# Patient Record
Sex: Female | Born: 1990 | Race: Black or African American | Hispanic: No | Smoking: Never smoker
Health system: Southern US, Community
[De-identification: ages and names within clinical notes are randomized; demographics above are authoritative.]

## PROBLEM LIST (undated history)

## (undated) DIAGNOSIS — F909 Attention-deficit hyperactivity disorder, unspecified type: Secondary | ICD-10-CM

## (undated) DIAGNOSIS — J45909 Unspecified asthma, uncomplicated: Secondary | ICD-10-CM

## (undated) DIAGNOSIS — S060X9A Concussion with loss of consciousness of unspecified duration, initial encounter: Secondary | ICD-10-CM

## (undated) DIAGNOSIS — T7840XA Allergy, unspecified, initial encounter: Secondary | ICD-10-CM

## (undated) DIAGNOSIS — K649 Unspecified hemorrhoids: Secondary | ICD-10-CM

## (undated) DIAGNOSIS — Z8489 Family history of other specified conditions: Secondary | ICD-10-CM

## (undated) DIAGNOSIS — M25519 Pain in unspecified shoulder: Secondary | ICD-10-CM

## (undated) DIAGNOSIS — S060XAA Concussion with loss of consciousness status unknown, initial encounter: Secondary | ICD-10-CM

## (undated) DIAGNOSIS — R635 Abnormal weight gain: Secondary | ICD-10-CM

## (undated) DIAGNOSIS — R51 Headache: Secondary | ICD-10-CM

## (undated) DIAGNOSIS — R42 Dizziness and giddiness: Secondary | ICD-10-CM

## (undated) HISTORY — DX: Abnormal weight gain: R63.5

## (undated) HISTORY — PX: FOOT SURGERY: SHX648

## (undated) HISTORY — DX: Allergy, unspecified, initial encounter: T78.40XA

## (undated) HISTORY — PX: OTHER SURGICAL HISTORY: SHX169

## (undated) HISTORY — DX: Unspecified asthma, uncomplicated: J45.909

---

## 2012-12-10 ENCOUNTER — Emergency Department (HOSPITAL_COMMUNITY): Payer: 59

## 2012-12-10 ENCOUNTER — Emergency Department (HOSPITAL_COMMUNITY)
Admission: EM | Admit: 2012-12-10 | Discharge: 2012-12-10 | Disposition: A | Discharge status: Home / Self Care | Payer: 59 | Attending: Emergency Medicine | Admitting: Emergency Medicine

## 2012-12-10 DIAGNOSIS — Z79899 Other long term (current) drug therapy: Secondary | ICD-10-CM | POA: Insufficient documentation

## 2012-12-10 DIAGNOSIS — Z3202 Encounter for pregnancy test, result negative: Secondary | ICD-10-CM | POA: Insufficient documentation

## 2012-12-10 DIAGNOSIS — K8050 Calculus of bile duct without cholangitis or cholecystitis without obstruction: Secondary | ICD-10-CM

## 2012-12-10 DIAGNOSIS — R112 Nausea with vomiting, unspecified: Secondary | ICD-10-CM | POA: Insufficient documentation

## 2012-12-10 DIAGNOSIS — K8020 Calculus of gallbladder without cholecystitis without obstruction: Secondary | ICD-10-CM | POA: Insufficient documentation

## 2012-12-10 LAB — CBC WITH DIFFERENTIAL/PLATELET
Basophils Absolute: 0.1 10*3/uL (ref 0.0–0.1)
Eosinophils Relative: 8 % — ABNORMAL HIGH (ref 0–5)
HCT: 34 % — ABNORMAL LOW (ref 36.0–46.0)
Hemoglobin: 11.5 g/dL — ABNORMAL LOW (ref 12.0–15.0)
Lymphocytes Relative: 39 % (ref 12–46)
MCHC: 33.8 g/dL (ref 30.0–36.0)
MCV: 83.3 fL (ref 78.0–100.0)
Monocytes Absolute: 0.6 10*3/uL (ref 0.1–1.0)
Monocytes Relative: 6 % (ref 3–12)
RDW: 13.1 % (ref 11.5–15.5)
WBC: 9.3 10*3/uL (ref 4.0–10.5)

## 2012-12-10 LAB — COMPREHENSIVE METABOLIC PANEL
AST: 16 U/L (ref 0–37)
BUN: 10 mg/dL (ref 6–23)
CO2: 26 mEq/L (ref 19–32)
Calcium: 8.9 mg/dL (ref 8.4–10.5)
Chloride: 105 mEq/L (ref 96–112)
Creatinine, Ser: 0.78 mg/dL (ref 0.50–1.10)
GFR calc Af Amer: >90 mL/min (ref >90)
GFR calc non Af Amer: >90 mL/min (ref >90)
Total Bilirubin: 0.1 mg/dL — ABNORMAL LOW (ref 0.3–1.2)

## 2012-12-10 LAB — URINALYSIS, ROUTINE W REFLEX MICROSCOPIC
Glucose, UA: NEGATIVE mg/dL
Ketones, ur: NEGATIVE mg/dL
Leukocytes, UA: NEGATIVE
Nitrite: NEGATIVE
Protein, ur: NEGATIVE mg/dL
Urobilinogen, UA: 1 mg/dL (ref 0.0–1.0)

## 2012-12-10 LAB — LIPASE, BLOOD: Lipase: 31 U/L (ref 11–59)

## 2012-12-10 LAB — POCT PREGNANCY, URINE: Preg Test, Ur: NEGATIVE

## 2012-12-10 MED ORDER — SODIUM CHLORIDE 0.9 % IV BOLUS (SEPSIS)
1000.0000 mL | Freq: Once | INTRAVENOUS | Status: AC
  Administered 2012-12-10: 1000 mL via INTRAVENOUS

## 2012-12-10 MED ORDER — ONDANSETRON HCL 4 MG/2ML IJ SOLN
4.0000 mg | Freq: Once | INTRAMUSCULAR | Status: AC
  Administered 2012-12-10: 4 mg via INTRAVENOUS
  Filled 2012-12-10: qty 2

## 2012-12-10 MED ORDER — HYDROMORPHONE HCL PF 1 MG/ML IJ SOLN
1.0000 mg | Freq: Once | INTRAMUSCULAR | Status: AC
  Administered 2012-12-10: 1 mg via INTRAVENOUS
  Filled 2012-12-10: qty 1

## 2012-12-10 NOTE — ED Notes (Signed)
Pt c/o RUQ pain that radiates to her back. Tender to palpation. N/V

## 2012-12-10 NOTE — ED Notes (Signed)
Patient transported to Ultrasound 

## 2012-12-10 NOTE — ED Notes (Signed)
MD at bedside. 

## 2012-12-10 NOTE — ED Provider Notes (Signed)
History     CSN: [id number]  Arrival date & time 12/10/12  0143   First MD Initiated Contact with Patient 12/10/12 0330      Chief Complaint  Patient presents with  . Abdominal Pain    (Consider location/radiation/quality/duration/timing/severity/associated sxs/prior treatment) Patient is a 22 y.o. female presenting with abdominal pain.  Abdominal Pain  Pt with no known medical problems reports several hours of persistent severe sharp RUQ pain after eating dinner. Associated with nausea and one episode of vomiting tonight. Pain has subsided some since arrival. She has had similar pains in the past but less severe and shorter duration. Has not been evaluated by PCP for same.   No past medical history on file.  No past surgical history on file.  No family history on file.  History  Substance Use Topics  . Smoking status: Not on file  . Smokeless tobacco: Not on file  . Alcohol Use: Not on file    OB History   No data available      Review of Systems  Gastrointestinal: Positive for abdominal pain.   All other systems reviewed and are negative except as noted in HPI.   Allergies  Tomato  Home Medications   Current Outpatient Rx  Name  Route  Sig  Dispense  Refill  . amphetamine-dextroamphetamine (ADDERALL) 20 MG tablet   Oral   Take 10 mg by mouth daily as needed (only takes as needed for schoolwork).         Marland Kitchen BIOTIN PO   Oral   Take 1 tablet by mouth daily.         . cetirizine (ZYRTEC) 10 MG tablet   Oral   Take 10 mg by mouth daily.           BP 123/87  Pulse 63  Temp(Src) 99.1 F (37.3 C) (Oral)  Resp 18  SpO2 100%  Physical Exam  Nursing note and vitals reviewed. Constitutional: She is oriented to person, place, and time. She appears well-developed and well-nourished.  HENT:  Head: Normocephalic and atraumatic.  Eyes: EOM are normal. Pupils are equal, round, and reactive to light.  Neck: Normal range of motion. Neck supple.   Cardiovascular: Normal rate, normal heart sounds and intact distal pulses.   Pulmonary/Chest: Effort normal and breath sounds normal.  Abdominal: Bowel sounds are normal. She exhibits no distension. There is tenderness (RUQ, positive Murphy's sign). There is guarding. There is no rebound.  Musculoskeletal: Normal range of motion. She exhibits no edema and no tenderness.  Neurological: She is alert and oriented to person, place, and time. She has normal strength. No cranial nerve deficit or sensory deficit.  Skin: Skin is warm and dry. No rash noted.  Psychiatric: She has a normal mood and affect.    ED Course  Procedures (including critical care time)  Labs Reviewed  CBC WITH DIFFERENTIAL - Abnormal; Notable for the following:    Hemoglobin 11.5 (*)    HCT 34.0 (*)    Eosinophils Relative 8 (*)    All other components within normal limits  COMPREHENSIVE METABOLIC PANEL - Abnormal; Notable for the following:    Potassium 3.2 (*)    Glucose, Bld 133 (*)    Albumin 3.4 (*)    Total Bilirubin 0.1 (*)    All other components within normal limits  LIPASE, BLOOD  URINALYSIS, ROUTINE W REFLEX MICROSCOPIC  POCT PREGNANCY, URINE   US Abdomen Complete  12/10/2012  *RADIOLOGY REPORT*  Clinical Data:  22 year old female right upper quadrant pain.  COMPLETE ABDOMINAL ULTRASOUND  Comparison:  None.  Findings:  Gallbladder:  Shadowing gallstone in the neck of the gallbladder (images 51 and 52).  Gallbladder wall thickness remains normal at 2 mm.  No sonographic Murphy's sign elicited.  The stone measures up to 12 mm diameter.  Additional smaller layering and shadowing echogenic stones in the gallbladder fundus.  Common bile duct:  Normal, 2 mm.  Liver:  No focal lesion identified.  Within normal limits in parenchymal echogenicity.  IVC:  Appears normal.  Pancreas:  No focal abnormality seen.  Spleen:  Normal measuring 7.9 cm in length.  Right Kidney:  Normal measuring 11.1 cm in length.  Left Kidney:   Normal measuring 11.5 cm in length.  Abdominal aorta:  No aneurysm identified.  IMPRESSION: 1.  Cholelithiasis.  A 12 mm stone might be lodged in the neck of the gallbladder, but there is no sonographic Murphy's sign or strong evidence of acute cholecystitis at this time. Clinical correlation recommended. 2.  No biliary ductal dilatation or other acute finding.   Original Report Authenticated By: Erskine Speed, M.D.      1. Biliary colic   2. Cholelithiasis       MDM  Pt feeling much better, pain has resolved. She has US showing stone (?lodged in neck) but no signs of cholecystitis. No leukocytosis or fever. Abdomen is benign. Advised to avoid fatty foods. General surgery followup. Return for worsening pain, fever or vomiting.         Alyric Parkin B. Bernette Mayers, MD 12/10/12 [phone]

## 2012-12-10 NOTE — ED Notes (Signed)
Phoned ultrasound to inquire about ETA for patient to be transferred to their department for ordered testing

## 2012-12-10 NOTE — ED Notes (Signed)
Patient returned from ultrasound.

## 2012-12-11 ENCOUNTER — Ambulatory Visit (INDEPENDENT_AMBULATORY_CARE_PROVIDER_SITE_OTHER): Payer: 59 | Admitting: Surgery

## 2012-12-11 ENCOUNTER — Encounter (INDEPENDENT_AMBULATORY_CARE_PROVIDER_SITE_OTHER): Payer: Self-pay | Admitting: Surgery

## 2012-12-11 VITALS — BP 110/70 | HR 66 | Temp 98.8°F | Resp 18 | Ht 73.0 in | Wt 235.8 lb

## 2012-12-11 DIAGNOSIS — K829 Disease of gallbladder, unspecified: Secondary | ICD-10-CM | POA: Insufficient documentation

## 2012-12-11 NOTE — Progress Notes (Signed)
Re:   Katherine Hahn DOB:   [birth year] MRN:   [id number]  ASSESSMENT AND PLAN: 1.  Symptomatic gall stones  I discussed with the patient the indications and risks of gall bladder surgery.  The primary risks of gall bladder surgery include, but are not limited to, bleeding, infection, common bile duct injury, and open surgery.  There is also the risk that the patient may have continued symptoms after surgery.  However, the likelihood of improvement in symptoms and return to the patient's normal status is good. We discussed the typical post-operative recovery course. I tried to answer the patient's questions.  I gave the patient literature about gall bladder surgery.  She is going to summer school - will schedule surgery around her schedule.  2.  Asthma. 3.  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 New Evaluation    eval GB   REFERRING PHYSICIAN:  Dr. Susy Frizzle, [facility]  ER.   HISTORY OF PRESENT ILLNESS: Katherine Hahn is a 22 y.o. (DOB: 1990-09-30)  AA  female whose primary care physician is Provider Not In System and comes to me today for gall bladder disease.  The patient has had symptoms of epigastric pain over the last year. She assumed it was gas and did nothing further regarding medical evaluation. Then about 2 days ago she had increasing severe pain. The pain is in the epigastrium and right upper quadrant. She has some nausea, she attributed this to the pain. She went to [facility] emergency room and was evaluated there. An ultrasound revealed gallstones. She had normal liver functions. She was released and referred to our office.  Her mother had emergency gallbladder surgery about 5 years ago.  She had to go back for a second procedure to get the stones out of her bile duct.  Her mother was on the phone.  The patient has no history of stomach, liver, pancreas, or colon disease. She had a prior abdominal surgery.  [place] - 12/10/2012 - 12  mm stone possibly lodged in the neck of the gall bladder LFT - 12/10/2012 - normal  Past Medical History  Diagnosis Date  . Asthma   . Allergy   . Weight gain      History reviewed. No pertinent past surgical history.    Current Outpatient Prescriptions  Medication Sig Dispense Refill  . amphetamine-dextroamphetamine (ADDERALL) 20 MG tablet Take 10 mg by mouth daily as needed (only takes as needed for schoolwork).      Marland Kitchen BIOTIN PO Take 1 tablet by mouth daily.      . cetirizine (ZYRTEC) 10 MG tablet Take 10 mg by mouth daily.       No current facility-administered medications for this visit.    Allergies  Allergen Reactions  . Tomato Itching    REVIEW OF SYSTEMS: Skin:  No history of rash.  No history of abnormal moles. Infection:  No history of hepatitis or HIV.  No history of MRSA. Neurologic:  No history of stroke.  No history of seizure.  No history of headaches. Cardiac:  No history of hypertension. No history of heart disease.  No history of prior cardiac catheterization.  No history of seeing a cardiologist. Pulmonary:  Does not smoke cigarettes.  History of asthma.  Endocrine:  No diabetes. No thyroid disease. Gastrointestinal:  No history of stomach disease.  No history of liver disease.  No history of gall bladder disease.  No  history of pancreas disease.  No history of colon disease. Urologic:  No history of kidney stones.  No history of bladder infections. Musculoskeletal:  Softening of the patellas.  She talks about needing a knee replacement if she were to continue to damage her knees. Hematologic:  No bleeding disorder.  No history of anemia.  Not anticoagulated. Psycho-social:  The patient is oriented.   The patient has no obvious psychologic or social impairment to understanding our conversation and plan.  SOCIAL and FAMILY HISTORY: Boyfirend, Clyde Lundborg, is with her. She is from [place], but plans to go to summer school. 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 I spoke to her mother, Merian Capron, on the phone.  She is in [place], [place].  PHYSICAL EXAM: BP 110/70  Pulse 66  Temp(Src) 98.8 F (37.1 C) (Temporal)  Resp 18  Ht 6\' 1"  (1.854 m)  Wt 235 lb 12.8 oz (106.958 kg)  BMI 31.12 kg/m2  General: Tall AA Female who is alert and generally healthy appearing.  HEENT: Normal. Pupils equal. Neck: Supple. No mass.  No thyroid mass. Lymph Nodes:  No supraclavicular or cervical nodes. Lungs: Clear to auscultation and symmetric breath sounds. Heart:  RRR. No murmur or rub. Abdomen: Soft. No mass. No tenderness. No hernia. Normal bowel sounds.  No abdominal scars. Rectal: Not done. Extremities:  Good strength and ROM  in upper and lower extremities. Neurologic:  Grossly intact to motor and sensory function. Psychiatric: Has normal mood and affect. Behavior is normal.   DATA REVIEWED: Labs in Epic from ER visit.  Ovidio Kin, MD,  [facility] [address] [place]  [address]   [place], [place]    [address] Phone:  [phone] FAX:  [phone]

## 2013-01-07 ENCOUNTER — Encounter (HOSPITAL_COMMUNITY): Payer: Self-pay | Admitting: Pharmacy Technician

## 2013-01-10 ENCOUNTER — Encounter (HOSPITAL_COMMUNITY)
Admission: RE | Admit: 2013-01-10 | Discharge: 2013-01-10 | Disposition: A | Discharge status: Home / Self Care | Payer: 59 | Source: Ambulatory Visit | Attending: Surgery | Admitting: Surgery

## 2013-01-10 ENCOUNTER — Encounter (HOSPITAL_COMMUNITY): Payer: Self-pay

## 2013-01-10 ENCOUNTER — Ambulatory Visit (HOSPITAL_COMMUNITY)
Admission: RE | Admit: 2013-01-10 | Discharge: 2013-01-10 | Disposition: A | Discharge status: Home / Self Care | Payer: 59 | Source: Ambulatory Visit | Attending: Anesthesiology | Admitting: Anesthesiology

## 2013-01-10 DIAGNOSIS — Z01810 Encounter for preprocedural cardiovascular examination: Secondary | ICD-10-CM | POA: Insufficient documentation

## 2013-01-10 DIAGNOSIS — Z01812 Encounter for preprocedural laboratory examination: Secondary | ICD-10-CM | POA: Insufficient documentation

## 2013-01-10 DIAGNOSIS — Z01818 Encounter for other preprocedural examination: Secondary | ICD-10-CM | POA: Insufficient documentation

## 2013-01-10 HISTORY — DX: Attention-deficit hyperactivity disorder, unspecified type: F90.9

## 2013-01-10 HISTORY — DX: Dizziness and giddiness: R42

## 2013-01-10 HISTORY — DX: Family history of other specified conditions: Z84.89

## 2013-01-10 HISTORY — DX: Headache: R51

## 2013-01-10 HISTORY — DX: Concussion with loss of consciousness of unspecified duration, initial encounter: S06.0X9A

## 2013-01-10 HISTORY — DX: Pain in unspecified shoulder: M25.519

## 2013-01-10 HISTORY — DX: Concussion with loss of consciousness status unknown, initial encounter: S06.0XAA

## 2013-01-10 HISTORY — DX: Unspecified hemorrhoids: K64.9

## 2013-01-10 LAB — CBC
HCT: 35.5 % — ABNORMAL LOW (ref 36.0–46.0)
MCH: 28.3 pg (ref 26.0–34.0)
MCV: 84.3 fL (ref 78.0–100.0)
Platelets: 266 10*3/uL (ref 150–400)
RDW: 12.8 % (ref 11.5–15.5)
WBC: 8.8 10*3/uL (ref 4.0–10.5)

## 2013-01-10 NOTE — Pre-Procedure Instructions (Signed)
Katherine Hahn  01/10/2013   Your procedure is scheduled on:  Friday, January 18, 2013  Report to [facility] Short Stay Center at 8:30 AM.  Call this number if you have problems the morning of surgery: [phone]   Remember:   Do not eat food or drink liquids after midnight.   Take these medicines the morning of surgery with A SIP OF WATER: cetirizine (ZYRTEC) 10 MG tablet Stop taking Aspirin and herbal medications. Do not take any NSAIDs ie: Ibuprofen, Advil, Naproxen or any medication containing Aspirin.             Do not wear jewelry, make-up or nail polish.  Do not wear lotions, powders, or perfumes. You may wear deodorant.  Do not shave 48 hours prior to surgery  Do not bring valuables to the hospital.  [facility] is not responsible  for any belongings or valuables.  Contacts, dentures or bridgework may not be worn into surgery.  Leave suitcase in the car. After surgery it may be brought to your room.  For patients admitted to the hospital, checkout time is 11:00 AM the day of discharge.   Patients discharged the day of surgery will not be allowed to drive home.  Name and phone number of your driver:   Special Instructions: Shower using CHG 2 nights before surgery and the night before surgery.  If you shower the day of surgery use CHG.  Use special wash - you have one bottle of CHG for all showers.  You should use approximately 1/3 of the bottle for each shower.   Please read over the following fact sheets that you were given: Pain Booklet, Coughing and Deep Breathing, MRSA Information and Surgical Site Infection Prevention

## 2013-01-10 NOTE — Progress Notes (Signed)
Pt has history of Asthma but denies SOB, chest pain, and being under the care of a cardiologist.

## 2013-01-11 NOTE — Progress Notes (Signed)
Notified dr. Allene Pyo office of needing preop orders.

## 2013-01-13 ENCOUNTER — Other Ambulatory Visit (INDEPENDENT_AMBULATORY_CARE_PROVIDER_SITE_OTHER): Payer: Self-pay | Admitting: Surgery

## 2013-01-17 MED ORDER — CEFAZOLIN SODIUM-DEXTROSE 2-3 GM-% IV SOLR
2.0000 g | INTRAVENOUS | Status: DC
  Filled 2013-01-17: qty 50

## 2013-01-18 ENCOUNTER — Encounter (HOSPITAL_COMMUNITY): Payer: Self-pay | Admitting: Critical Care Medicine

## 2013-01-18 ENCOUNTER — Encounter (HOSPITAL_COMMUNITY)
Admission: RE | Disposition: A | Discharge status: Home / Self Care | Payer: Self-pay | Source: Ambulatory Visit | Attending: Surgery

## 2013-01-18 ENCOUNTER — Ambulatory Visit (HOSPITAL_COMMUNITY)
Admission: RE | Admit: 2013-01-18 | Discharge: 2013-01-18 | Disposition: A | Discharge status: Home / Self Care | Payer: 59 | Source: Ambulatory Visit | Attending: Surgery | Admitting: Surgery

## 2013-01-18 ENCOUNTER — Ambulatory Visit (HOSPITAL_COMMUNITY): Payer: 59 | Admitting: Critical Care Medicine

## 2013-01-18 ENCOUNTER — Ambulatory Visit (HOSPITAL_COMMUNITY): Payer: 59

## 2013-01-18 DIAGNOSIS — Z79899 Other long term (current) drug therapy: Secondary | ICD-10-CM | POA: Insufficient documentation

## 2013-01-18 DIAGNOSIS — K829 Disease of gallbladder, unspecified: Secondary | ICD-10-CM

## 2013-01-18 DIAGNOSIS — Z6831 Body mass index (BMI) 31.0-31.9, adult: Secondary | ICD-10-CM | POA: Insufficient documentation

## 2013-01-18 DIAGNOSIS — K8010 Calculus of gallbladder with chronic cholecystitis without obstruction: Secondary | ICD-10-CM | POA: Insufficient documentation

## 2013-01-18 DIAGNOSIS — J45909 Unspecified asthma, uncomplicated: Secondary | ICD-10-CM | POA: Insufficient documentation

## 2013-01-18 DIAGNOSIS — F909 Attention-deficit hyperactivity disorder, unspecified type: Secondary | ICD-10-CM | POA: Insufficient documentation

## 2013-01-18 DIAGNOSIS — Z91018 Allergy to other foods: Secondary | ICD-10-CM | POA: Insufficient documentation

## 2013-01-18 HISTORY — PX: CHOLECYSTECTOMY: SHX55

## 2013-01-18 LAB — COMPREHENSIVE METABOLIC PANEL
ALT: 27 U/L (ref 0–35)
AST: 27 U/L (ref 0–37)
Albumin: 3.5 g/dL (ref 3.5–5.2)
CO2: 24 mEq/L (ref 19–32)
Calcium: 8.8 mg/dL (ref 8.4–10.5)
Creatinine, Ser: 0.65 mg/dL (ref 0.50–1.10)
Sodium: 137 mEq/L (ref 135–145)
Total Protein: 7.2 g/dL (ref 6.0–8.3)

## 2013-01-18 LAB — CBC
HCT: 33.3 % — ABNORMAL LOW (ref 36.0–46.0)
Hemoglobin: 11.5 g/dL — ABNORMAL LOW (ref 12.0–15.0)
MCH: 29 pg (ref 26.0–34.0)
MCHC: 34.5 g/dL (ref 30.0–36.0)
MCV: 84.1 fL (ref 78.0–100.0)
RBC: 3.96 MIL/uL (ref 3.87–5.11)

## 2013-01-18 SURGERY — LAPAROSCOPIC CHOLECYSTECTOMY WITH INTRAOPERATIVE CHOLANGIOGRAM
Anesthesia: General | Wound class: Clean Contaminated

## 2013-01-18 MED ORDER — PROPOFOL 10 MG/ML IV BOLUS
INTRAVENOUS | Status: DC | PRN
  Administered 2013-01-18: 120 mg via INTRAVENOUS

## 2013-01-18 MED ORDER — BUPIVACAINE HCL (PF) 0.25 % IJ SOLN
INTRAMUSCULAR | Status: AC
  Filled 2013-01-18: qty 30

## 2013-01-18 MED ORDER — VECURONIUM BROMIDE 10 MG IV SOLR
INTRAVENOUS | Status: DC | PRN
  Administered 2013-01-18: 1 mg via INTRAVENOUS

## 2013-01-18 MED ORDER — NEOSTIGMINE METHYLSULFATE 1 MG/ML IJ SOLN
INTRAMUSCULAR | Status: DC | PRN
  Administered 2013-01-18: 5 mg via INTRAVENOUS

## 2013-01-18 MED ORDER — ROCURONIUM BROMIDE 100 MG/10ML IV SOLN
INTRAVENOUS | Status: DC | PRN
  Administered 2013-01-18: 50 mg via INTRAVENOUS

## 2013-01-18 MED ORDER — BUPIVACAINE HCL 0.25 % IJ SOLN
INTRAMUSCULAR | Status: DC | PRN
  Administered 2013-01-18: 9 mL
  Administered 2013-01-18: 20 mL

## 2013-01-18 MED ORDER — LACTATED RINGERS IV SOLN
INTRAVENOUS | Status: DC
  Administered 2013-01-18 (×2): via INTRAVENOUS

## 2013-01-18 MED ORDER — MIDAZOLAM HCL 2 MG/2ML IJ SOLN: 0.5000 mg | INTRAMUSCULAR | Status: DC | PRN

## 2013-01-18 MED ORDER — ONDANSETRON HCL 4 MG/2ML IJ SOLN: 4.0000 mg | Freq: Once | INTRAMUSCULAR | Status: DC | PRN

## 2013-01-18 MED ORDER — CHLORHEXIDINE GLUCONATE 4 % EX LIQD: 1.0000 "application " | Freq: Once | CUTANEOUS | Status: DC

## 2013-01-18 MED ORDER — HYDROMORPHONE HCL PF 1 MG/ML IJ SOLN
INTRAMUSCULAR | Status: AC
  Filled 2013-01-18: qty 1

## 2013-01-18 MED ORDER — ONDANSETRON HCL 4 MG/2ML IJ SOLN
INTRAMUSCULAR | Status: DC | PRN
  Administered 2013-01-18: 4 mg via INTRAVENOUS

## 2013-01-18 MED ORDER — MIDAZOLAM HCL 2 MG/ML PO SYRP: 0.5000 mg/kg | ORAL_SOLUTION | Freq: Once | ORAL | Status: DC

## 2013-01-18 MED ORDER — FENTANYL CITRATE 0.05 MG/ML IJ SOLN: 50.0000 ug | INTRAMUSCULAR | Status: DC | PRN

## 2013-01-18 MED ORDER — OXYCODONE HCL 5 MG PO TABS
ORAL_TABLET | ORAL | Status: AC
  Filled 2013-01-18: qty 1

## 2013-01-18 MED ORDER — LIDOCAINE HCL (CARDIAC) 20 MG/ML IV SOLN
INTRAVENOUS | Status: DC | PRN
  Administered 2013-01-18: 100 mg via INTRAVENOUS

## 2013-01-18 MED ORDER — HYDROMORPHONE HCL PF 1 MG/ML IJ SOLN
0.2500 mg | INTRAMUSCULAR | Status: DC | PRN
  Administered 2013-01-18 (×3): 0.5 mg via INTRAVENOUS

## 2013-01-18 MED ORDER — MIDAZOLAM HCL 5 MG/5ML IJ SOLN
INTRAMUSCULAR | Status: DC | PRN
  Administered 2013-01-18: 2 mg via INTRAVENOUS

## 2013-01-18 MED ORDER — OXYCODONE HCL 5 MG/5ML PO SOLN: 5.0000 mg | Freq: Once | ORAL | Status: AC | PRN

## 2013-01-18 MED ORDER — GLYCOPYRROLATE 0.2 MG/ML IJ SOLN
INTRAMUSCULAR | Status: DC | PRN
  Administered 2013-01-18: .8 mg via INTRAVENOUS

## 2013-01-18 MED ORDER — SODIUM CHLORIDE 0.9 % IV SOLN
INTRAVENOUS | Status: DC | PRN
  Administered 2013-01-18: 11:00:00

## 2013-01-18 MED ORDER — HYDROCODONE-ACETAMINOPHEN 5-325 MG PO TABS: 1.0000 | ORAL_TABLET | Freq: Four times a day (QID) | ORAL | Status: DC | PRN

## 2013-01-18 MED ORDER — FENTANYL CITRATE 0.05 MG/ML IJ SOLN
INTRAMUSCULAR | Status: DC | PRN
  Administered 2013-01-18: 100 ug via INTRAVENOUS
  Administered 2013-01-18 (×2): 50 ug via INTRAVENOUS

## 2013-01-18 MED ORDER — MEPERIDINE HCL 25 MG/ML IJ SOLN: 6.2500 mg | INTRAMUSCULAR | Status: DC | PRN

## 2013-01-18 MED ORDER — OXYCODONE HCL 5 MG PO TABS
5.0000 mg | ORAL_TABLET | Freq: Once | ORAL | Status: AC | PRN
  Administered 2013-01-18: 5 mg via ORAL

## 2013-01-18 MED ORDER — DEXAMETHASONE SODIUM PHOSPHATE 4 MG/ML IJ SOLN
INTRAMUSCULAR | Status: DC | PRN
  Administered 2013-01-18: 4 mg via INTRAVENOUS

## 2013-01-18 SURGICAL SUPPLY — 49 items
APPLIER CLIP 5 13 M/L LIGAMAX5 (MISCELLANEOUS)
APPLIER CLIP ROT 10 11.4 M/L (STAPLE) ×2
BLADE SURG ROTATE 9660 (MISCELLANEOUS) IMPLANT
CANISTER SUCTION 2500CC (MISCELLANEOUS) ×2 IMPLANT
CHLORAPREP W/TINT 26ML (MISCELLANEOUS) ×2 IMPLANT
CHOLANGIOGRAM CATH TAUT (CATHETERS) ×2 IMPLANT
CLIP APPLIE 5 13 M/L LIGAMAX5 (MISCELLANEOUS) IMPLANT
CLIP APPLIE ROT 10 11.4 M/L (STAPLE) ×1 IMPLANT
CLOTH BEACON ORANGE TIMEOUT ST (SAFETY) ×2 IMPLANT
COVER MAYO STAND STRL (DRAPES) ×2 IMPLANT
COVER SURGICAL LIGHT HANDLE (MISCELLANEOUS) ×2 IMPLANT
DECANTER SPIKE VIAL GLASS SM (MISCELLANEOUS) ×2 IMPLANT
DERMABOND ADVANCED (GAUZE/BANDAGES/DRESSINGS) ×1
DERMABOND ADVANCED .7 DNX12 (GAUZE/BANDAGES/DRESSINGS) ×1 IMPLANT
DRAPE C-ARM 42X72 X-RAY (DRAPES) ×2 IMPLANT
DRAPE UTILITY 15X26 W/TAPE STR (DRAPE) ×4 IMPLANT
ELECT REM PT RETURN 9FT ADLT (ELECTROSURGICAL) ×2
ELECTRODE REM PT RTRN 9FT ADLT (ELECTROSURGICAL) ×1 IMPLANT
FILTER SMOKE EVAC LAPAROSHD (FILTER) ×2 IMPLANT
GLOVE BIO SURGEON STRL SZ7 (GLOVE) ×2 IMPLANT
GLOVE BIO SURGEON STRL SZ7.5 (GLOVE) ×2 IMPLANT
GLOVE BIOGEL PI IND STRL 7.0 (GLOVE) ×2 IMPLANT
GLOVE BIOGEL PI IND STRL 7.5 (GLOVE) ×2 IMPLANT
GLOVE BIOGEL PI INDICATOR 7.0 (GLOVE) ×2
GLOVE BIOGEL PI INDICATOR 7.5 (GLOVE) ×2
GLOVE ECLIPSE 7.5 STRL STRAW (GLOVE) ×2 IMPLANT
GLOVE SURG SIGNA 7.5 PF LTX (GLOVE) ×2 IMPLANT
GOWN PREVENTION PLUS XXLARGE (GOWN DISPOSABLE) ×2 IMPLANT
GOWN STRL NON-REIN LRG LVL3 (GOWN DISPOSABLE) ×10 IMPLANT
GOWN STRL REIN XL XLG (GOWN DISPOSABLE) ×2 IMPLANT
IV CATH 14GX2 1/4 (CATHETERS) ×2 IMPLANT
KIT BASIN OR (CUSTOM PROCEDURE TRAY) ×2 IMPLANT
KIT ROOM TURNOVER OR (KITS) ×2 IMPLANT
NS IRRIG 1000ML POUR BTL (IV SOLUTION) ×2 IMPLANT
PAD ARMBOARD 7.5X6 YLW CONV (MISCELLANEOUS) ×2 IMPLANT
POUCH SPECIMEN RETRIEVAL 10MM (ENDOMECHANICALS) ×2 IMPLANT
SCISSORS LAP 5X35 DISP (ENDOMECHANICALS) IMPLANT
SET IRRIG TUBING LAPAROSCOPIC (IRRIGATION / IRRIGATOR) ×2 IMPLANT
SPECIMEN JAR SMALL (MISCELLANEOUS) ×2 IMPLANT
STOPCOCK 4 WAY LG BORE MALE ST (IV SETS) ×2 IMPLANT
SUT VIC AB 5-0 PS2 18 (SUTURE) ×2 IMPLANT
TOWEL OR 17X24 6PK STRL BLUE (TOWEL DISPOSABLE) ×2 IMPLANT
TOWEL OR 17X26 10 PK STRL BLUE (TOWEL DISPOSABLE) ×2 IMPLANT
TRAY LAPAROSCOPIC (CUSTOM PROCEDURE TRAY) ×2 IMPLANT
TROCAR XCEL BLUNT TIP 100MML (ENDOMECHANICALS) ×2 IMPLANT
TROCAR XCEL NON-BLD 11X100MML (ENDOMECHANICALS) ×2 IMPLANT
TROCAR XCEL NON-BLD 5MMX100MML (ENDOMECHANICALS) ×4 IMPLANT
TUBING EXTENTION W/L.L. (IV SETS) ×2 IMPLANT
WATER STERILE IRR 1000ML POUR (IV SOLUTION) IMPLANT

## 2013-01-18 NOTE — Anesthesia Postprocedure Evaluation (Signed)
Anesthesia Post Note  Patient: Katherine Hahn  Procedure(s) Performed: Procedure(s) (LRB): LAPAROSCOPIC CHOLECYSTECTOMY WITH INTRAOPERATIVE CHOLANGIOGRAM (N/A)  Anesthesia type: general  Patient location: PACU  Post pain: Pain level controlled  Post assessment: Patient's Cardiovascular Status Stable  Last Vitals:  Filed Vitals:   01/18/13 1245  BP: 140/74  Pulse: 72  Temp:   Resp: 17    Post vital signs: Reviewed and stable  Level of consciousness: sedated  Complications: No apparent anesthesia complications

## 2013-01-18 NOTE — Progress Notes (Signed)
Lab at bedside to draw cbc, bmp, appear to be preop lab

## 2013-01-18 NOTE — Progress Notes (Signed)
Short stay called pt put on hold

## 2013-01-18 NOTE — Anesthesia Procedure Notes (Signed)
Procedure Name: Intubation Date/Time: 01/18/2013 10:07 AM Performed by: Elon Alas Pre-anesthesia Checklist: Timeout performed, Patient identified, Emergency Drugs available, Suction available and Patient being monitored Patient Re-evaluated:Patient Re-evaluated prior to inductionOxygen Delivery Method: Circle system utilized Preoxygenation: Pre-oxygenation with 100% oxygen Intubation Type: IV induction Ventilation: Mask ventilation without difficulty Laryngoscope Size: 4 and Mac Grade View: Grade I Tube type: Oral Tube size: 7.5 mm Number of attempts: 1 Airway Equipment and Method: Stylet Placement Confirmation: positive ETCO2,  ETT inserted through vocal cords under direct vision and breath sounds checked- equal and bilateral Secured at: 23 cm Tube secured with: Tape Dental Injury: Teeth and Oropharynx as per pre-operative assessment

## 2013-01-18 NOTE — Anesthesia Preprocedure Evaluation (Addendum)
Anesthesia Evaluation  Patient identified by MRN, date of birth, ID band Patient awake    Reviewed: Allergy & Precautions, H&P , NPO status , Patient's Chart, lab work & pertinent test results  Airway Mallampati: I TM Distance: >3 FB Neck ROM: Full    Dental  (+) Dental Advisory Given   Pulmonary asthma ,          Cardiovascular     Neuro/Psych  Headaches, PSYCHIATRIC DISORDERS ADHD   GI/Hepatic   Endo/Other  Morbid obesity  Renal/GU      Musculoskeletal   Abdominal   Peds  Hematology   Anesthesia Other Findings   Reproductive/Obstetrics                          Anesthesia Physical Anesthesia Plan  ASA: II  Anesthesia Plan: General   Post-op Pain Management:    Induction: Intravenous  Airway Management Planned: Oral ETT  Additional Equipment:   Intra-op Plan:   Post-operative Plan: Extubation in OR  Informed Consent: I have reviewed the patients History and Physical, chart, labs and discussed the procedure including the risks, benefits and alternatives for the proposed anesthesia with the patient or authorized representative who has indicated his/her understanding and acceptance.   Dental advisory given  Plan Discussed with: Surgeon and CRNA  Anesthesia Plan Comments:        Anesthesia Quick Evaluation

## 2013-01-18 NOTE — H&P (Signed)
Re: Katherine Hahn  DOB: 11/13/1990  MRN: [id number]   ASSESSMENT AND PLAN:  1. Symptomatic gall stones   I discussed with the patient the indications and risks of gall bladder surgery. The primary risks of gall bladder surgery include, but are not limited to, bleeding, infection, common bile duct injury, and open surgery. There is also the risk that the patient may have continued symptoms after surgery. However, the likelihood of improvement in symptoms and return to the patient's normal status is good. We discussed the typical post-operative recovery course. I tried to answer the patient's questions.   I gave the patient literature about gall bladder surgery.   She is going to summer school - will schedule surgery around her schedule.   2. Asthma.  3. 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  New Evaluation     eval GB    REFERRING PHYSICIAN: Dr. Susy Frizzle, [facility] ER.   HISTORY OF PRESENT ILLNESS:  Katherine Hahn is a 22 y.o. (DOB: 14-Jul-1991) AA female whose primary care physician is Provider Not In System and comes to me today for gall bladder disease.  The patient has had symptoms of epigastric pain over the last year. She assumed it was gas and did nothing further regarding medical evaluation. Then about 2 days ago she had increasing severe pain. The pain is in the epigastrium and right upper quadrant. She has some nausea, she attributed this to the pain. She went to [facility] emergency room and was evaluated there. An ultrasound revealed gallstones. She had normal liver functions. She was released and referred to our office.   Her mother had emergency gallbladder surgery about 5 years ago. She had to go back for a second procedure to get the stones out of her bile duct. Her mother was on the phone. The patient has no history of stomach, liver, pancreas, or colon disease. She had a prior abdominal surgery.  [place] - 12/10/2012 - 12 mm stone  possibly lodged in the neck of the gall bladder  LFT - 12/10/2012 - normal   Past Medical History   Diagnosis  Date   .  Asthma    .  Allergy    .  Weight gain    History reviewed. No pertinent past surgical history.  Current Outpatient Prescriptions   Medication  Sig  Dispense  Refill   .  amphetamine-dextroamphetamine (ADDERALL) 20 MG tablet  Take 10 mg by mouth daily as needed (only takes as needed for schoolwork).     Marland Kitchen  BIOTIN PO  Take 1 tablet by mouth daily.     .  cetirizine (ZYRTEC) 10 MG tablet  Take 10 mg by mouth daily.      No current facility-administered medications for this visit.    Allergies   Allergen  Reactions   .  Tomato  Itching    REVIEW OF SYSTEMS:  Skin: No history of rash. No history of abnormal moles.  Infection: No history of hepatitis or HIV. No history of MRSA.  Neurologic: No history of stroke. No history of seizure. No history of headaches.  Cardiac: No history of hypertension. No history of heart disease. No history of prior cardiac catheterization. No history of seeing a cardiologist.  Pulmonary: Does not smoke cigarettes. History of asthma.  Endocrine: No diabetes. No thyroid disease.  Gastrointestinal: No history of stomach disease. No history of liver disease. No history  of gall bladder disease. No history of pancreas disease. No history of colon disease.  Urologic: No history of kidney stones. No history of bladder infections.  Musculoskeletal: Softening of the patellas. She talks about needing a knee replacement if she were to continue to damage her knees.  Hematologic: No bleeding disorder. No history of anemia. Not anticoagulated.  Psycho-social: The patient is oriented. The patient has no obvious psychologic or social impairment to understanding our conversation and plan.   SOCIAL and FAMILY HISTORY:  Boyfirend, Clyde Lundborg, is with her.  She is from [place], but plans to go to summer school.  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  I spoke to her mother, Merian Capron, on the phone. She is in [place], [place].   PHYSICAL EXAM:  BP 122/76  Pulse 66  Temp(Src) 98.3 F (36.8 C) (Oral)  Resp 20  SpO2 98%  LMP 01/08/2013 Wt 235 lb 12.8 oz (106.958 kg)  BMI 31.12 kg/m2   General: Tall AA Female who is alert and generally healthy appearing.  HEENT: Normal. Pupils equal.  Neck: Supple. No mass. No thyroid mass.  Lymph Nodes: No supraclavicular or cervical nodes.  Lungs: Clear to auscultation and symmetric breath sounds.  Heart: RRR. No murmur or rub.  Abdomen: Soft. No mass. No tenderness. No hernia. Normal bowel sounds. No abdominal scars.  Rectal: Not done.  Extremities: Good strength and ROM in upper and lower extremities.  Neurologic: Grossly intact to motor and sensory function.  Psychiatric: Has normal mood and affect. Behavior is normal.   DATA REVIEWED:  Labs in Epic from ER visit.  Ovidio Kin, MD, [facility]  [address] [place]  [place], [place] [address]  Phone: [phone] FAX: [phone]

## 2013-01-18 NOTE — Transfer of Care (Signed)
Immediate Anesthesia Transfer of Care Note  Patient: Katherine Hahn  Procedure(s) Performed: Procedure(s): LAPAROSCOPIC CHOLECYSTECTOMY WITH INTRAOPERATIVE CHOLANGIOGRAM (N/A)  Patient Location: PACU  Anesthesia Type:general  Level of Consciousness: awake, alert  and oriented  Airway & Oxygen Therapy: Patient Spontanous Breathing and Patient connected to nasal cannula oxygen  Post-op Assessment: Report given to PACU RN, Post -op Vital signs reviewed and stable and Patient moving all extremities X 4  Post vital signs: Reviewed and stable  Complications: No apparent anesthesia complications

## 2013-01-18 NOTE — Preoperative (Signed)
Beta Blockers   Reason not to administer Beta Blockers:Not Applicable 

## 2013-01-18 NOTE — Op Note (Signed)
01/18/2013  11:09 AM  PATIENT:  Katherine Hahn, 22 y.o., female, MRN: [id number]  PREOP DIAGNOSIS:  Symptomatic Cholelithiasis  POSTOP DIAGNOSIS:   Chronic cholecystitis, cholelithiasis  PROCEDURE:   Procedure(s): LAPAROSCOPIC CHOLECYSTECTOMY WITH INTRAOPERATIVE CHOLANGIOGRAM  SURGEON:   Ovidio Kin, M.D.  ASSISTANT:   A. Maisie Fus, M.D.  ANESTHESIA:   general  Anesthesiologist: Aubery Lapping, MD CRNA: Elon Alas, CRNA  General  ASA: 2  EBL:  minimal  ml  BLOOD ADMINISTERED: none  DRAINS: none   LOCAL MEDICATIONS USED:   29 cc 1/4% marcaine  SPECIMEN:   Gall bladder  COUNTS CORRECT:  YES  INDICATIONS FOR PROCEDURE:  Katherine Hahn is a 22 y.o. (DOB: 26-Feb-1991) AA  female whose primary care physician is Provider Not In System and comes for cholecystectomy.   The indications and risks of the gall bladder surgery were explained to the patient.  The risks include, but are not limited to, infection, bleeding, common bile duct injury and open surgery.  SURGERY:  The patient was taken to room #2 at [facility].  The abdomen was prepped with chloroprep.  The patient was given 2 gm Ancef at the beginning of the operation.   A time out was held and the surgical checklist run.   An infraumbilical incision was made into the abdominal cavity.  A 12 mm Hasson trocar was inserted into the abdominal cavity through the infraumbilical incision and secured with a 0 Vicryl suture.  Three additional trocars were inserted: a 10 mm trocar in the sub-xiphoid location, a 5 mm trocar in the right mid subcostal area, and a 5 mm trocar in the right lateral subcostal area.   The abdomen was explored and the liver, stomach, and bowel that could be seen were unremarkable.   The gall bladder was identified, grasped, and rotated cephalad.  Disssection was carried down to the gall bladder/cystic duct junction and the cystic duct isolated.  A clip was placed on the gall bladder side of the  cystic duct.   An intra-operative cholangiogram was shot.   The intra-operative cholangiogram was shot using a cut off Taut catheter placed through a 14 gauge angiocath in the RUQ.  The Taut catheter was inserted in the cut cystic duct and secured with an endoclip.  A cholangiogram was shot with 15 cc of 1/2 strength Omnipaque.  Using fluoroscopy, the cholangiogram showed the flow of contrast into the common bile duct, up the hepatic radicals, and into the duodenum.  There was no mass or obstruction.  This was a normal intra-operative cholangiogram.   The Taut catheter was removed.  The cystic duct was tripley endoclipped and the cystic artery was identified and clipped.  The gall bladder was bluntly and sharpley dissected from the gall bladder bed.   After the gall bladder was removed from the liver, the gall bladder bed and Tri[place] of [place] were inspected.  There was no bleeding or bile leak.  The gall bladder was placed in a endocatch bag and delivered through the umbilicus.  The abdomen was irrigated with 500 cc saline.   The trocars were then removed.  I infiltrated 29 cc of 1/4% Marcaine into the incisions.  The umbilical port closed with a 0 Vicryl suture and the skin closed with 5-0 monocryl.  The skin was painted with Dermabond.  The patient's sponge and needle count were correct.  The patient was transported to the RR in good condition.   The plan is to let her go  home today.  Ovidio Kin, MD, [facility] Surgery Pager: [phone] Office phone:  [phone]

## 2013-01-18 NOTE — Progress Notes (Signed)
Dr. Ossey called for a sign out 

## 2013-01-22 ENCOUNTER — Encounter (HOSPITAL_COMMUNITY): Payer: Self-pay | Admitting: Surgery

## 2013-02-05 ENCOUNTER — Telehealth (INDEPENDENT_AMBULATORY_CARE_PROVIDER_SITE_OTHER): Payer: Self-pay

## 2013-02-05 ENCOUNTER — Encounter (INDEPENDENT_AMBULATORY_CARE_PROVIDER_SITE_OTHER): Payer: 59 | Admitting: Surgery

## 2013-02-05 NOTE — Telephone Encounter (Signed)
V/m   Post op appt  rescheduled per patients request ; NEW APPT   02/14/13 @ 5p with Dr. Ezzard Standing

## 2013-02-14 ENCOUNTER — Encounter (INDEPENDENT_AMBULATORY_CARE_PROVIDER_SITE_OTHER): Payer: Self-pay | Admitting: Surgery

## 2013-02-14 ENCOUNTER — Ambulatory Visit (INDEPENDENT_AMBULATORY_CARE_PROVIDER_SITE_OTHER): Payer: 59 | Admitting: Surgery

## 2013-02-14 VITALS — BP 124/86 | HR 68 | Resp 18 | Ht 73.0 in | Wt 234.4 lb

## 2013-02-14 DIAGNOSIS — K829 Disease of gallbladder, unspecified: Secondary | ICD-10-CM

## 2013-02-14 NOTE — Progress Notes (Signed)
   Re:   Katherine Hahn DOB:   27-Aug-1990 MRN:   [id number]  ASSESSMENT AND PLAN: 1.  Symptomatic gall stones  Lap chole with IOC - 01/18/2013 - D. Adriahna Shearman  Doing well except some digestion issues.  Return appointment PRN.  2.  Asthma. 3.  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 Routine Post Op    Lap chole   REFERRING PHYSICIAN:  Dr. Susy Frizzle, [facility].   HISTORY OF PRESENT ILLNESS: Katherine Hahn is a 22 y.o. (DOB: [birth year])  AA  female whose primary care physician is Provider Not In System and comes to me today for follow up of cholecystectomy.  Her boyfriend is with her.  She has done well.  She has a small suture that she can feel at her epigastric incision.  She also notices that some foods bother her stomach.  Some of this should improve with time.  We talked about the gall bladder and its role in digestion. She is in school right now.  Past Medical History  Diagnosis Date  . Asthma   . Allergy   . Weight gain   . Family history of anesthesia complication     Mom has Hx: of Nausea Vomiting  . Headache(784.0)     hX; of migraines  . Concussion     Hx: of  . Adult ADHD     Hx: of  . Vertigo     Hx; of with sinus problems  . Shoulder pain     Hx; of " in the morning then it goes away."  . Hemorrhoids     Hx: of     Current Outpatient Prescriptions  Medication Sig Dispense Refill  . amphetamine-dextroamphetamine (ADDERALL) 20 MG tablet Take 10 mg by mouth daily as needed (only takes as needed for schoolwork).      Marland Kitchen BIOTIN PO Take 1 tablet by mouth daily.      . cetirizine (ZYRTEC) 10 MG tablet Take 10 mg by mouth daily.      Marland Kitchen HYDROcodone-acetaminophen (NORCO/VICODIN) 5-325 MG per tablet Take 1-2 tablets by mouth every 6 (six) hours as needed for pain.  30 tablet  1   No current facility-administered medications for this visit.   Allergies  Allergen Reactions  . Tomato Itching    REVIEW OF  SYSTEMS: Pulmonary:  Does not smoke cigarettes.  History of asthma. Musculoskeletal:  Softening of the patellas.  She talks about needing a knee replacement if she were to continue to damage her knees.  SOCIAL and FAMILY HISTORY: Boyfirend, Katherine Hahn, is with her. She is from [place], but plans to go to summer school. 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 I spoke to her mother, Katherine Hahn, on the phone.  She is in [place], [place].  PHYSICAL EXAM: BP 124/86  Pulse 68  Resp 18  Ht 6\' 1"  (1.854 m)  Wt 234 lb 6.4 oz (106.323 kg)  BMI 30.93 kg/m2  General: Tall AA Female who is alert and generally healthy appearing.  Abdomen: Soft. No mass. No tenderness.   Normal bowel sounds.  Wounds look good.  DATA REVIEWED: Path to patient.  Ovidio Kin, MD,  [facility] [address] [place]  [address]   [place], [place]    [address] Phone:  [phone] FAX:  [phone]

## 2013-08-16 ENCOUNTER — Other Ambulatory Visit (HOSPITAL_COMMUNITY)
Admission: RE | Admit: 2013-08-16 | Discharge: 2013-08-16 | Disposition: A | Discharge status: Home / Self Care | Payer: 59 | Source: Ambulatory Visit | Attending: Obstetrics & Gynecology | Admitting: Obstetrics & Gynecology

## 2013-08-16 ENCOUNTER — Other Ambulatory Visit: Payer: Self-pay | Admitting: Obstetrics & Gynecology

## 2013-08-16 DIAGNOSIS — N760 Acute vaginitis: Secondary | ICD-10-CM | POA: Insufficient documentation

## 2013-08-16 DIAGNOSIS — Z124 Encounter for screening for malignant neoplasm of cervix: Secondary | ICD-10-CM | POA: Insufficient documentation

## 2013-08-16 DIAGNOSIS — R87810 Cervical high risk human papillomavirus (HPV) DNA test positive: Secondary | ICD-10-CM | POA: Insufficient documentation

## 2013-08-16 DIAGNOSIS — Z1151 Encounter for screening for human papillomavirus (HPV): Secondary | ICD-10-CM | POA: Insufficient documentation

## 2013-10-18 ENCOUNTER — Other Ambulatory Visit: Payer: Self-pay | Admitting: Obstetrics & Gynecology

## 2013-10-18 DIAGNOSIS — R234 Changes in skin texture: Secondary | ICD-10-CM

## 2013-10-21 ENCOUNTER — Other Ambulatory Visit: Payer: Managed Care, Other (non HMO)

## 2013-10-21 ENCOUNTER — Ambulatory Visit
Admission: RE | Admit: 2013-10-21 | Discharge: 2013-10-21 | Disposition: A | Discharge status: Home / Self Care | Payer: 59 | Source: Ambulatory Visit | Attending: Obstetrics & Gynecology | Admitting: Obstetrics & Gynecology

## 2013-10-21 DIAGNOSIS — R234 Changes in skin texture: Secondary | ICD-10-CM

## 2013-12-12 ENCOUNTER — Encounter (HOSPITAL_COMMUNITY): Payer: Self-pay | Admitting: Emergency Medicine

## 2013-12-12 ENCOUNTER — Emergency Department (INDEPENDENT_AMBULATORY_CARE_PROVIDER_SITE_OTHER)
Admission: EM | Admit: 2013-12-12 | Discharge: 2013-12-12 | Disposition: A | Discharge status: Home / Self Care | Payer: 59 | Source: Home / Self Care | Attending: Family Medicine | Admitting: Family Medicine

## 2013-12-12 DIAGNOSIS — J029 Acute pharyngitis, unspecified: Secondary | ICD-10-CM

## 2013-12-12 LAB — POCT RAPID STREP A: Streptococcus, Group A Screen (Direct): NEGATIVE

## 2013-12-12 MED ORDER — AMOXICILLIN 500 MG PO CAPS: 1000.0000 mg | ORAL_CAPSULE | Freq: Two times a day (BID) | ORAL | Status: AC

## 2013-12-12 MED ORDER — FLUTICASONE PROPIONATE 50 MCG/ACT NA SUSP: 2.0000 | Freq: Every day | NASAL | Status: AC

## 2013-12-12 NOTE — Discharge Instructions (Signed)
Thank you for coming in today. Pharyngitis Pharyngitis is redness, pain, and swelling (inflammation) of your pharynx.  CAUSES  Pharyngitis is usually caused by infection. Most of the time, these infections are from viruses (viral) and are part of a cold. However, sometimes pharyngitis is caused by bacteria (bacterial). Pharyngitis can also be caused by allergies. Viral pharyngitis may be spread from person to person by coughing, sneezing, and personal items or utensils (cups, forks, spoons, toothbrushes). Bacterial pharyngitis may be spread from person to person by more intimate contact, such as kissing.  SIGNS AND SYMPTOMS  Symptoms of pharyngitis include:   Sore throat.   Tiredness (fatigue).   Low-grade fever.   Headache.  Joint pain and muscle aches.  Skin rashes.  Swollen lymph nodes.  Plaque-like film on throat or tonsils (often seen with bacterial pharyngitis). DIAGNOSIS  Your health care provider will ask you questions about your illness and your symptoms. Your medical history, along with a physical exam, is often all that is needed to diagnose pharyngitis. Sometimes, a rapid strep test is done. Other lab tests may also be done, depending on the suspected cause.  TREATMENT  Viral pharyngitis will usually get better in 3 4 days without the use of medicine. Bacterial pharyngitis is treated with medicines that kill germs (antibiotics).  HOME CARE INSTRUCTIONS   Drink enough water and fluids to keep your urine clear or pale yellow.   Only take over-the-counter or prescription medicines as directed by your health care provider:   If you are prescribed antibiotics, make sure you finish them even if you start to feel better.   Do not take aspirin.   Get lots of rest.   Gargle with 8 oz of salt water ( tsp of salt per 1 qt of water) as often as every 1 2 hours to soothe your throat.   Throat lozenges (if you are not at risk for choking) or sprays may be used to  soothe your throat. SEEK MEDICAL CARE IF:   You have large, tender lumps in your neck.  You have a rash.  You cough up green, yellow-brown, or bloody spit. SEEK IMMEDIATE MEDICAL CARE IF:   Your neck becomes stiff.  You drool or are unable to swallow liquids.  You vomit or are unable to keep medicines or liquids down.  You have severe pain that does not go away with the use of recommended medicines.  You have trouble breathing (not caused by a stuffy nose). MAKE SURE YOU:   Understand these instructions.  Will watch your condition.  Will get help right away if you are not doing well or get worse. Document Released: 07/18/2005 Document Revised: 05/08/2013 Document Reviewed: 03/25/2013 [facility] Patient Information 2014 [place], [place].

## 2013-12-12 NOTE — ED Notes (Signed)
C/o  Sore throat.  On set last night.  Hx of recurrent strep.  Pain with swallowing and soreness at back of tongue.  No otc meds taken.  Denies fever.

## 2013-12-12 NOTE — ED Provider Notes (Signed)
Katherine Hahn is a 23 y.o. female who presents to Urgent Care today for sore throat. Patient notes significant sore throat starting yesterday evening. Symptoms are consistent with strep throat. She denies any cough congestion or runny nose. She denies any significant fever or chills. She's tried Motrin which has helped a bit. No nausea vomiting or diarrhea. Pain is moderate and worse with swallowing.   Past Medical History  Diagnosis Date  . Asthma   . Allergy   . Weight gain   . Family history of anesthesia complication     Mom has Hx: of Nausea Vomiting  . Headache(784.0)     hX; of migraines  . Concussion     Hx: of  . Adult ADHD     Hx: of  . Vertigo     Hx; of with sinus problems  . Shoulder pain     Hx; of " in the morning then it goes away."  . Hemorrhoids     Hx: of   History  Substance Use Topics  . Smoking status: Never Smoker   . Smokeless tobacco: Never Used  . Alcohol Use: 0.6 oz/week    1 Glasses of wine per week     Comment: Monthly.   ROS as above Medications: No current facility-administered medications for this encounter.   Current Outpatient Prescriptions  Medication Sig Dispense Refill  . amphetamine-dextroamphetamine (ADDERALL) 20 MG tablet Take 10 mg by mouth daily as needed (only takes as needed for schoolwork).      Marland Kitchen. BIOTIN PO Take 1 tablet by mouth daily.      . cetirizine (ZYRTEC) 10 MG tablet Take 10 mg by mouth daily.      Marland Kitchen. amoxicillin (AMOXIL) 500 MG capsule Take 2 capsules (1,000 mg total) by mouth 2 (two) times daily.  40 capsule  0  . fluticasone (FLONASE) 50 MCG/ACT nasal spray Place 2 sprays into both nostrils daily.  16 g  2    Exam:  BP 141/81  Pulse 80  Temp(Src) 98.5 F (36.9 C) (Oral)  Resp 18  SpO2 100%  LMP 11/26/2013 Gen: Well NAD HEENT: EOMI,  MMM erythematous posterior pharynx with cobblestoning without exudate. Normal tympanic membranes bilaterally. Lungs: Normal work of breathing. CTABL Heart: RRR no MRG Abd:  NABS, Soft. NT, ND Exts: Brisk capillary refill, warm and well perfused.   Results for orders placed during the hospital encounter of 12/12/13 (from the past 24 hour(s))  POCT RAPID STREP A ([facility]RG CARE ONLY)     Status: None   Collection Time    12/12/13  9:46 AM      Result Value Ref Range   Streptococcus, Group A Screen (Direct) NEGATIVE  NEGATIVE   No results found.  Assessment and Plan: 23 y.o. female with pharyngitis. Likely strep. Plan to treat with amoxicillin. Treat postnasal drip with Flonase spray. Awaiting throat culture  Discussed warning signs or symptoms. Please see discharge instructions. Patient expresses understanding.    Rodolph BongEvan S Amana Bouska, MD 12/12/13 [phone]

## 2013-12-14 LAB — CULTURE, GROUP A STREP

## 2014-08-13 ENCOUNTER — Other Ambulatory Visit (HOSPITAL_COMMUNITY)
Admission: RE | Admit: 2014-08-13 | Discharge: 2014-08-13 | Disposition: A | Discharge status: Home / Self Care | Payer: 59 | Source: Ambulatory Visit | Attending: Obstetrics & Gynecology | Admitting: Obstetrics & Gynecology

## 2014-08-13 ENCOUNTER — Other Ambulatory Visit: Payer: Self-pay | Admitting: Obstetrics & Gynecology

## 2014-08-13 DIAGNOSIS — Z01411 Encounter for gynecological examination (general) (routine) with abnormal findings: Secondary | ICD-10-CM | POA: Insufficient documentation

## 2014-08-14 LAB — CYTOLOGY - PAP

## 2014-08-17 IMAGING — US US ABDOMEN COMPLETE
1 series · 14 of 25 positions shown · non-contrast
Comparison: None.

CLINICAL DATA: 21-year-old female right upper quadrant pain.

COMPLETE ABDOMINAL ULTRASOUND

[Series 1: us abdomen complete · 0.25mm/px · 14 of 75 slices shown]
[im 1/75]
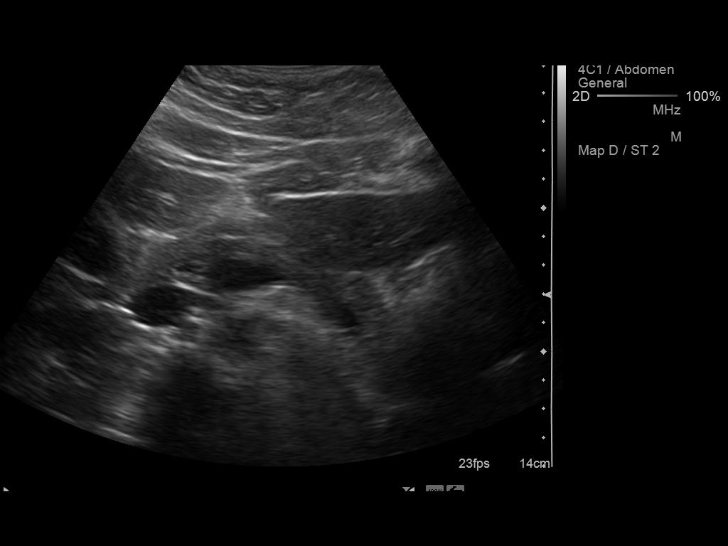
[im 7/75]
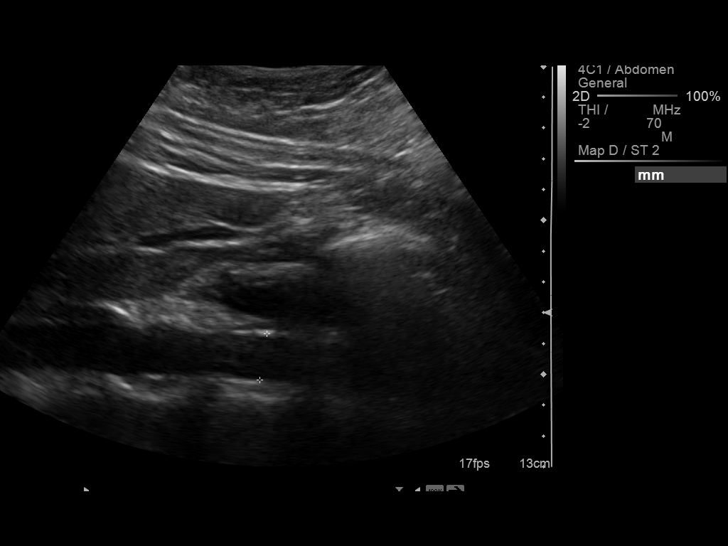
[im 13/75]
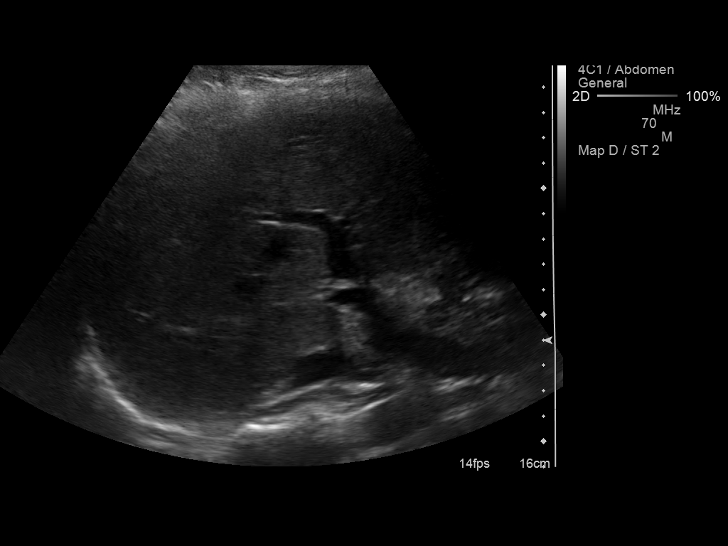
[im 19/75]
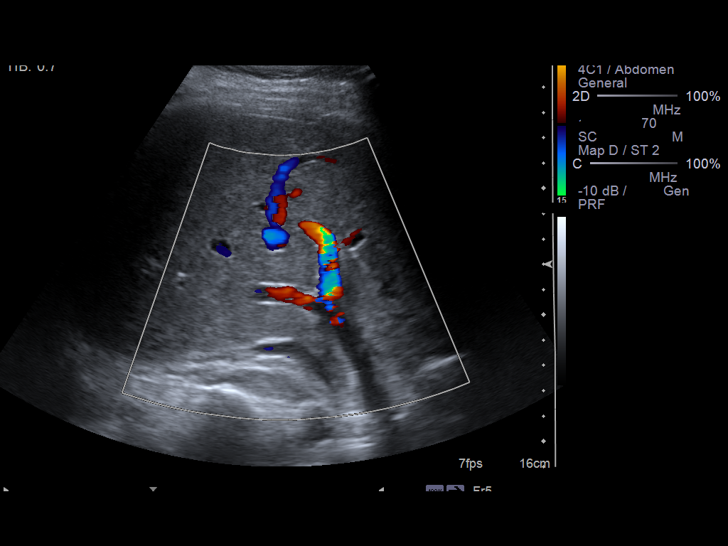
[im 25/75]
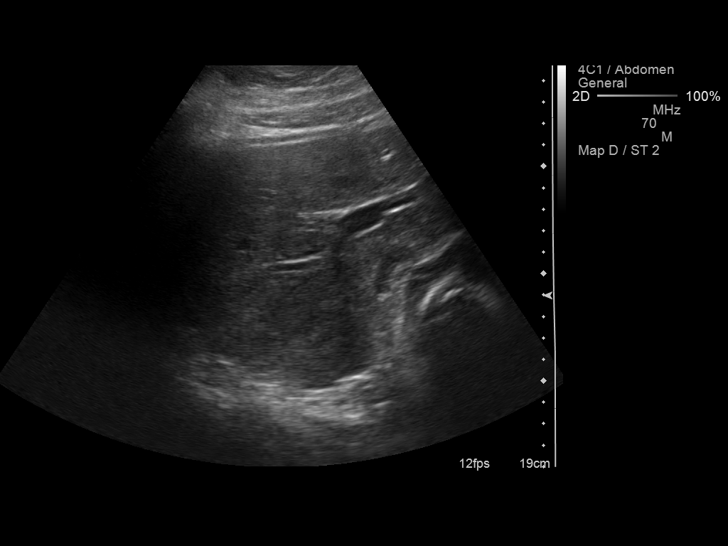
[im 28/75]
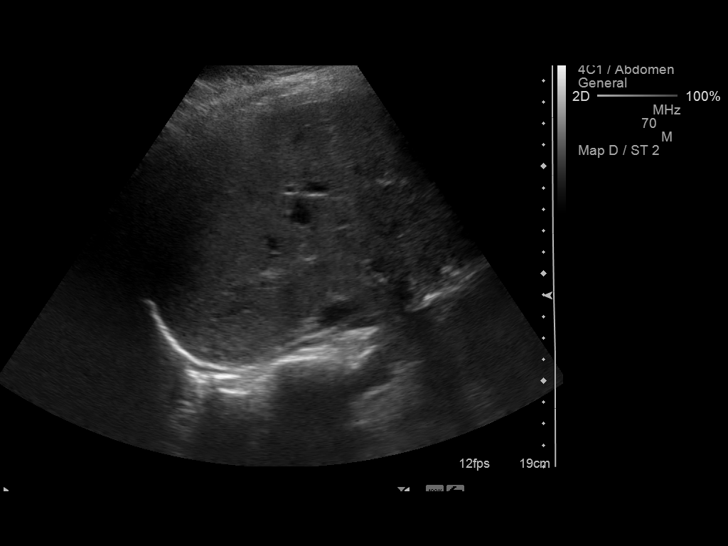
[im 34/75]
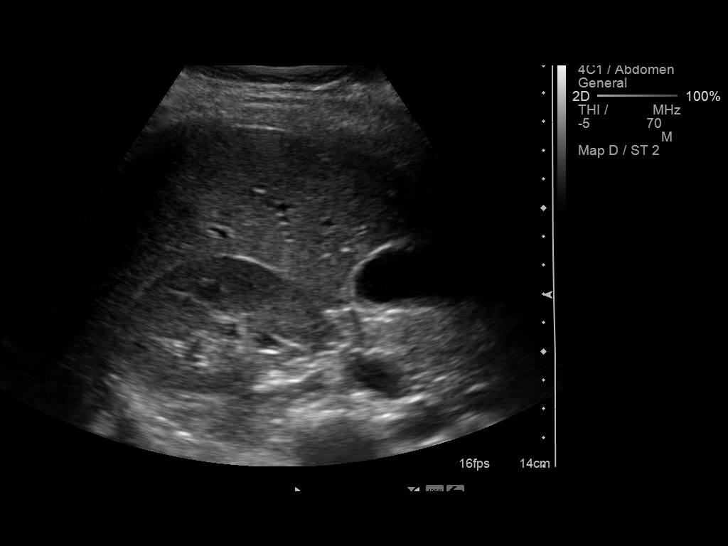
[im 41/75]
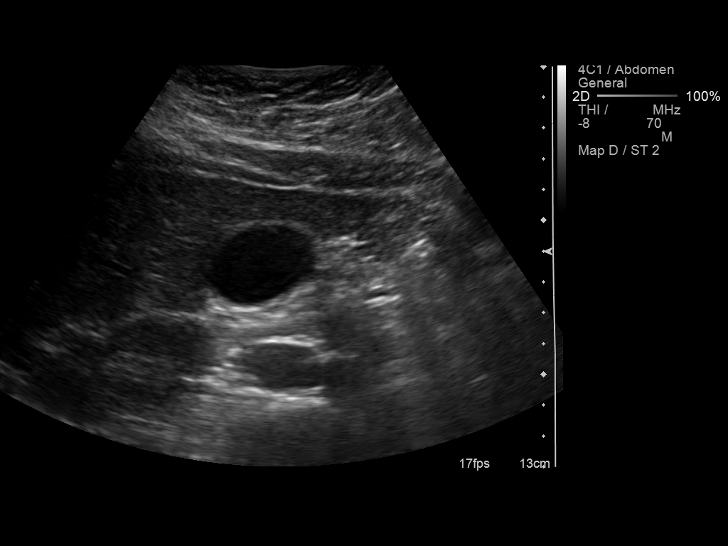
[im 47/75]
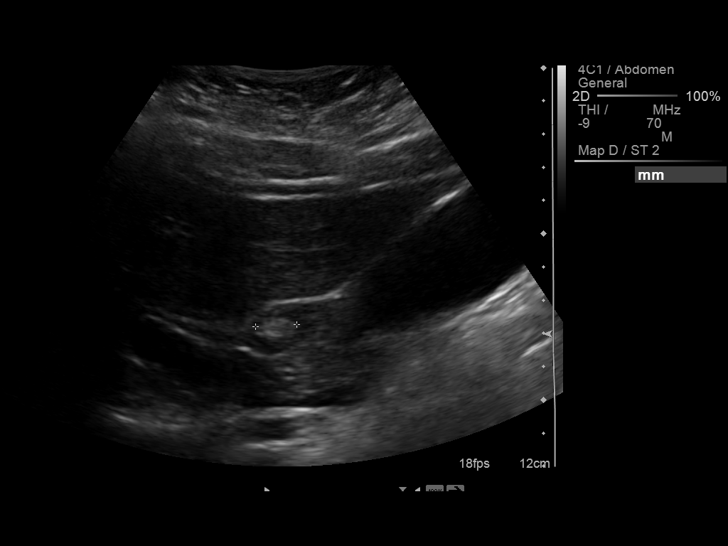
[im 50/75]
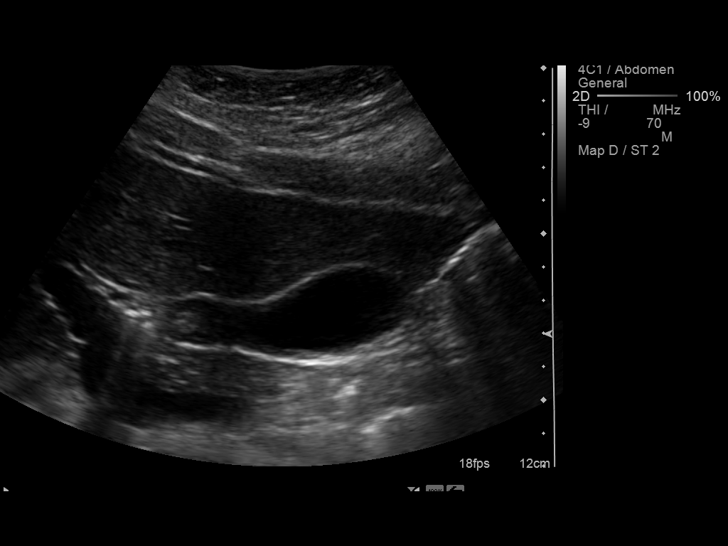
[im 56/75]
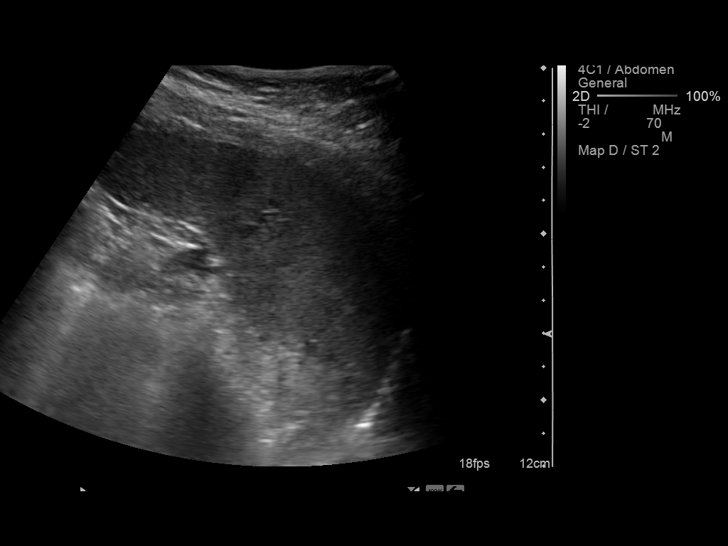
[im 62/75]
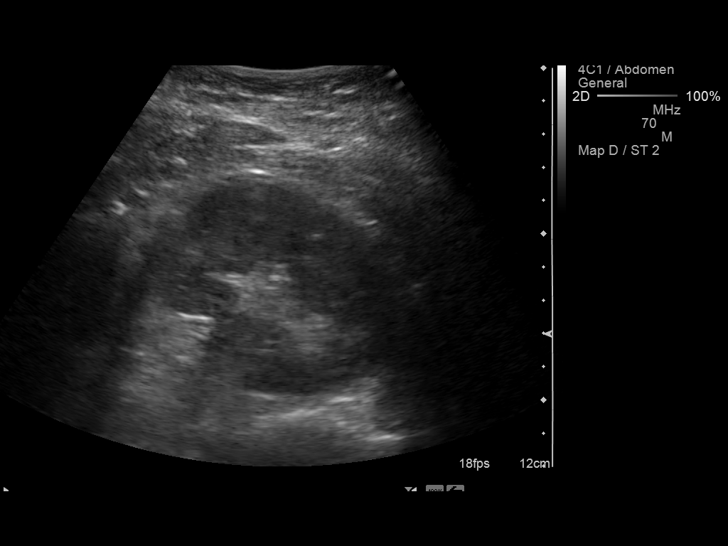
[im 68/75]
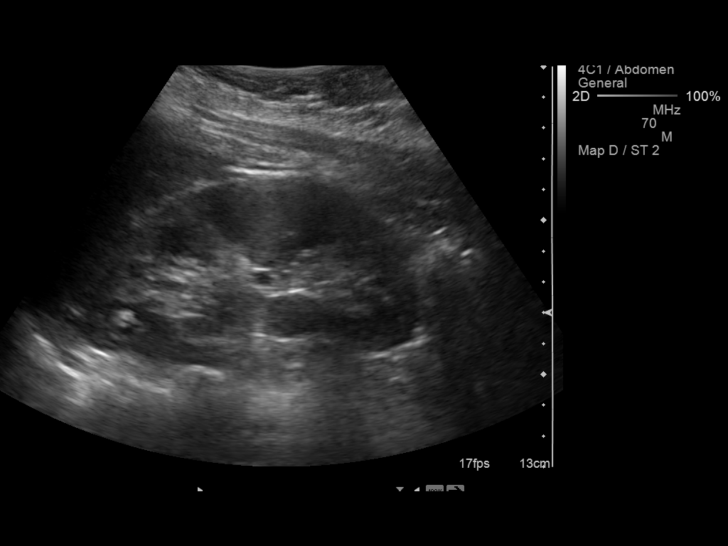
[im 75/75]
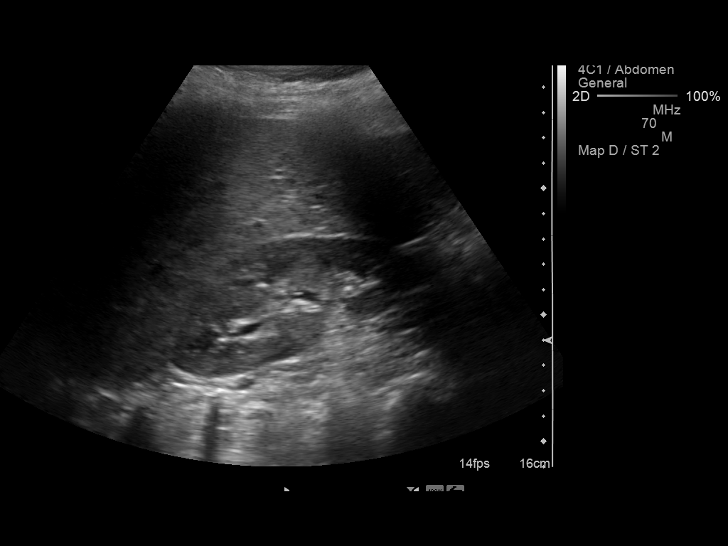

[14 of 25 positions shown; findings below may reference images not displayed]

FINDINGS: Gallbladder:  Shadowing gallstone in the neck of the gallbladder
(images 51 and 52).  Gallbladder wall thickness remains normal at 2
mm.  No sonographic Murphy's sign elicited.  The stone measures up
to 12 mm diameter.  Additional smaller layering and shadowing
echogenic stones in the gallbladder fundus.

Common bile duct:  Normal, 2 mm.

Liver:  No focal lesion identified.  Within normal limits in
parenchymal echogenicity.

IVC:  Appears normal.

Pancreas:  No focal abnormality seen.

Spleen:  Normal measuring 7.9 cm in length.

Right Kidney:  Normal measuring 11.1 cm in length.

Left Kidney:  Normal measuring 11.5 cm in length.

Abdominal aorta:  No aneurysm identified.
IMPRESSION: 1.  Cholelithiasis.  A 12 mm stone might be lodged in the neck of
the gallbladder, but there is no sonographic Murphy's sign or
strong evidence of acute cholecystitis at this time. Clinical
correlation recommended.
2.  No biliary ductal dilatation or other acute finding.

## 2014-09-17 IMAGING — CR DG CHEST 2V
2 series · 2 of 2 positions shown · non-contrast
Comparison: None.

CLINICAL DATA: Preop.

CHEST - 2 VIEW

[view not recorded (1 of 2)]
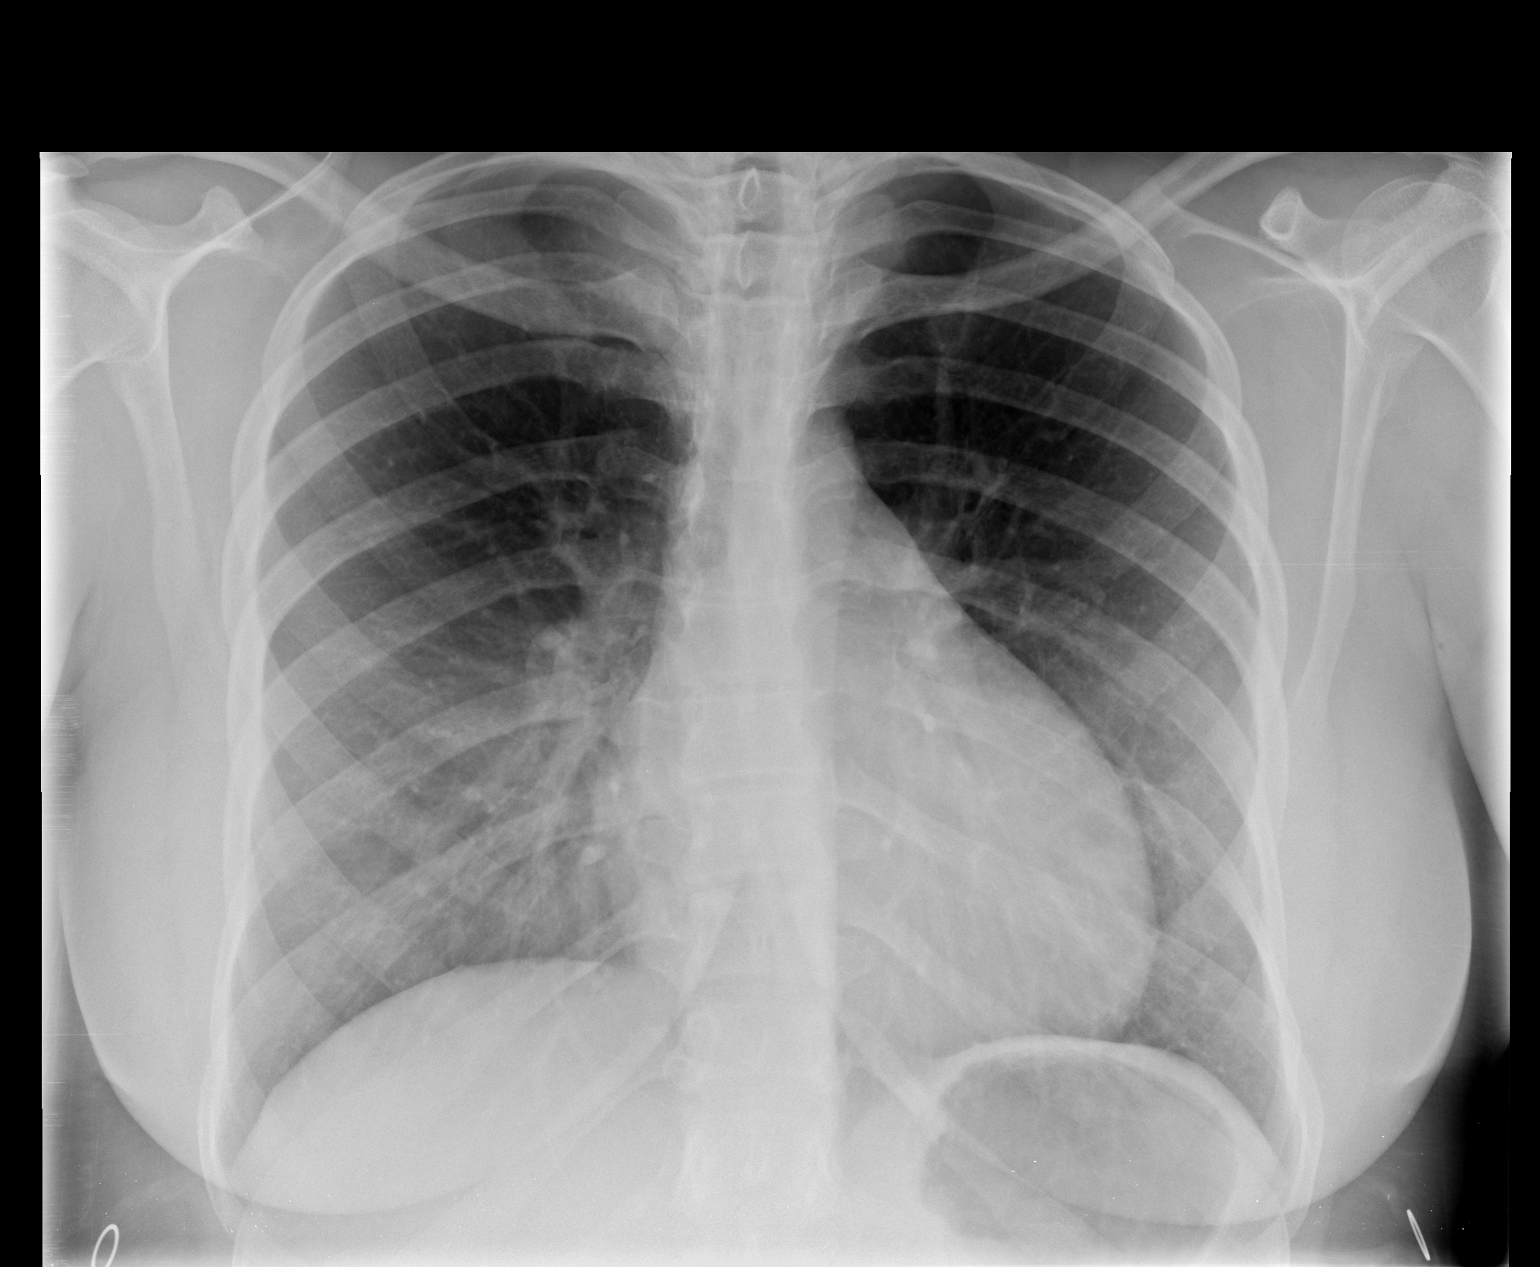

[view not recorded (2 of 2)]
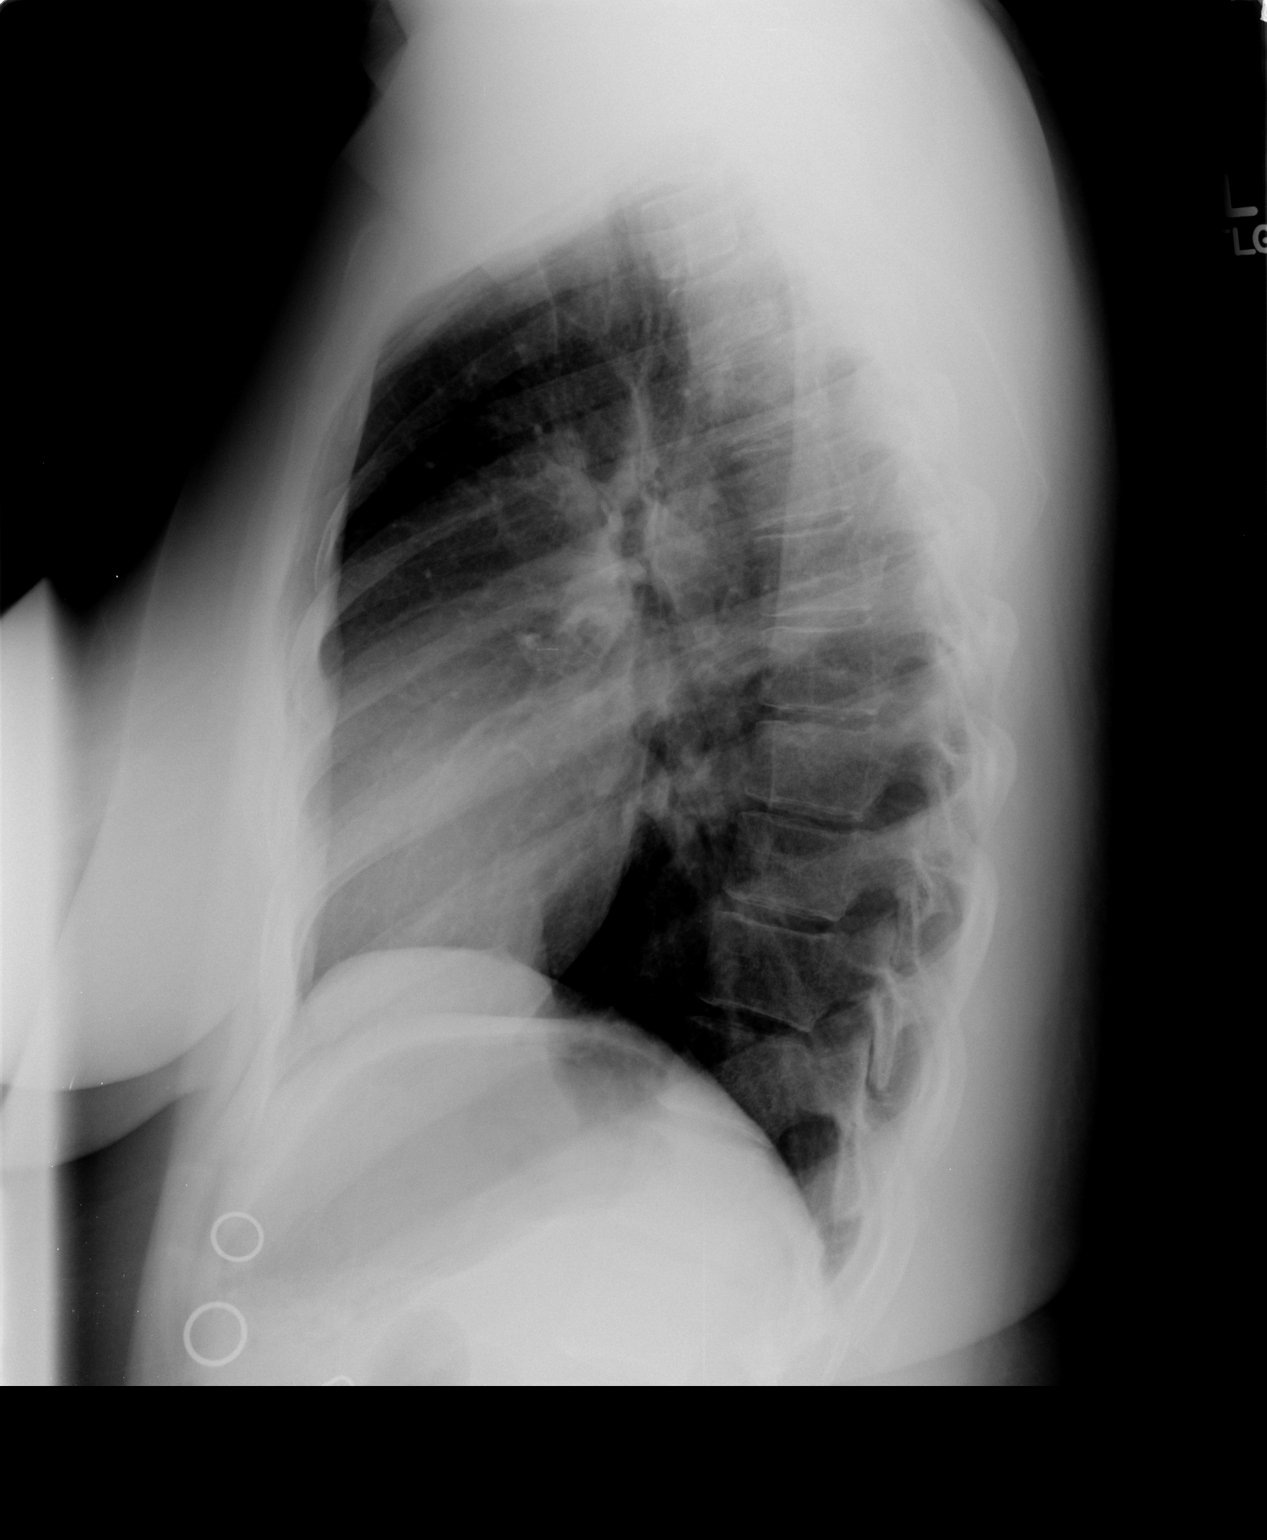

[2 of 2 positions shown; findings below may reference images not displayed]

FINDINGS: Trachea is midline.  Heart size within normal limits.
Lungs are clear.  No pleural fluid.
IMPRESSION: Negative.
